# Patient Record
Sex: Male | Born: 1990
Health system: Southern US, Community
[De-identification: ages and names within clinical notes are randomized; demographics above are authoritative.]

## PROBLEM LIST (undated history)

## (undated) DIAGNOSIS — K219 Gastro-esophageal reflux disease without esophagitis: Secondary | ICD-10-CM

## (undated) DIAGNOSIS — E7800 Pure hypercholesterolemia, unspecified: Secondary | ICD-10-CM

---
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
## 2011-11-15 DIAGNOSIS — L731 Pseudofolliculitis barbae: Secondary | ICD-10-CM | POA: Insufficient documentation

## 2012-12-01 ENCOUNTER — Encounter (HOSPITAL_COMMUNITY): Payer: Self-pay | Admitting: Emergency Medicine

## 2012-12-01 ENCOUNTER — Emergency Department (HOSPITAL_COMMUNITY)
Admission: EM | Admit: 2012-12-01 | Discharge: 2012-12-01 | Disposition: A | Discharge status: Home / Self Care | Payer: PRIVATE HEALTH INSURANCE | Source: Home / Self Care

## 2012-12-01 DIAGNOSIS — M549 Dorsalgia, unspecified: Secondary | ICD-10-CM

## 2012-12-01 LAB — POCT URINALYSIS DIP (DEVICE)
Bilirubin Urine: NEGATIVE
Hgb urine dipstick: NEGATIVE
Ketones, ur: NEGATIVE mg/dL
pH: 6 (ref 5.0–8.0)

## 2012-12-01 NOTE — ED Notes (Signed)
Reports right back pain for 3 days.  Denies abdominal pain, denies penile discharge, denies pain with urination.  Patient reports similar episodes that seemed to improve with water, cranberry juice and/or decreasing alcohol intake.

## 2012-12-01 NOTE — ED Provider Notes (Signed)
Joshua Wolfe is a 22 y.o. male who presents to Urgent Care today for right sided mid to low back pain present for the last few days. Not particularly worse with activity. He thinks this is possibly a kidney infection. He denies any dysuria fevers or chills abdominal pain nausea vomiting or diarrhea. He's tried increasing water and cranberry juice intake. He is a Consulting civil engineer at [employer].  The pain is mild to moderate and nonradiating.    PMH reviewed. Healthy otherwise  History  Substance Use Topics  . Smoking status: Never Smoker   . Smokeless tobacco: Not on file  . Alcohol Use: Yes   ROS as above Medications reviewed. No current facility-administered medications for this encounter.   No current outpatient prescriptions on file.    Exam:  BP 105/75  Pulse 58  Temp(Src) 97.3 F (36.3 C) (Oral)  Resp 12  SpO2 100% Gen: Well NAD HEENT: EOMI,  MMM Lungs: CTABL Nl WOB Heart: RRR no MRG Abd: NABS, NT, ND, No CV angle tenderness to percussion Exts: Non edematous BL  LE, warm and well perfused.   Results for orders placed during the hospital encounter of 12/01/12 (from the past 24 hour(s))  POCT URINALYSIS DIP (DEVICE)     Status: None   Collection Time    12/01/12 12:35 PM      Result Value Range   Glucose, UA NEGATIVE  NEGATIVE mg/dL   Bilirubin Urine NEGATIVE  NEGATIVE   Ketones, ur NEGATIVE  NEGATIVE mg/dL   Specific Gravity, Urine <=1.005  1.005 - 1.030   Hgb urine dipstick NEGATIVE  NEGATIVE   pH 6.0  5.0 - 8.0   Protein, ur NEGATIVE  NEGATIVE mg/dL   Urobilinogen, UA 0.2  0.0 - 1.0 mg/dL   Nitrite NEGATIVE  NEGATIVE   Leukocytes, UA NEGATIVE  NEGATIVE   No results found.  Assessment and Plan: 22 y.o. male with back pain. Weekly musculoskeletal. Patient's urine is very dilute because he has been drinking lots of water. I feel the urine culture is reasonable as he may have urine to dilute for the dipstick urine to detect leukocytes.  Plan to treat back pain with  NSAIDs and heat.  Followup as needed.  Discussed warning signs or symptoms. Please see discharge instructions. Patient expresses understanding.      Rodolph Bong, MD 12/01/12 1300

## 2012-12-03 LAB — URINE CULTURE: Culture: NO GROWTH

## 2013-07-30 ENCOUNTER — Emergency Department (INDEPENDENT_AMBULATORY_CARE_PROVIDER_SITE_OTHER)
Admission: EM | Admit: 2013-07-30 | Discharge: 2013-07-30 | Disposition: A | Discharge status: Home / Self Care | Payer: PRIVATE HEALTH INSURANCE | Source: Home / Self Care

## 2013-07-30 ENCOUNTER — Encounter (HOSPITAL_COMMUNITY): Payer: Self-pay | Admitting: Emergency Medicine

## 2013-07-30 DIAGNOSIS — R0789 Other chest pain: Secondary | ICD-10-CM

## 2013-07-30 DIAGNOSIS — R071 Chest pain on breathing: Secondary | ICD-10-CM

## 2013-07-30 DIAGNOSIS — M940 Chondrocostal junction syndrome [Tietze]: Secondary | ICD-10-CM

## 2013-07-30 HISTORY — DX: Gastro-esophageal reflux disease without esophagitis: K21.9

## 2013-07-30 HISTORY — DX: Pure hypercholesterolemia, unspecified: E78.00

## 2013-07-30 NOTE — ED Notes (Signed)
Pain in left chest for a month.  Pain first noticed when working out in gym: felt like "spasm" but no longer feels like "spasm" when it occurs .  Pain is intermittent with a frequency of 2-3 episodes /week.  Pain is sharp, and lasts "few seconds".  Also described "heart feeling weak".  Reports resting and taking slow deep breaths seems to improve how is feeling when these episodes occur.  Mother thinks stress is the cause of pain: patient is a senior at [employer], has several senior projects to complete in a short period of time.  Patient majoring in Lobbyistcomputer science.

## 2013-07-30 NOTE — ED Provider Notes (Signed)
Medical screening examination/treatment/procedure(s) were performed by non-physician practitioner and as supervising physician I was immediately available for consultation/collaboration.  Leslee Homeavid Hristopher Missildine, M.D.  Reuben Likesavid C Traci Gafford, MD 07/30/13 [phone]

## 2013-07-30 NOTE — Discharge Instructions (Signed)
Chest Wall Pain Chest wall pain is pain in or around the bones and muscles of your chest. It may take up to 6 weeks to get better. It may take longer if you must stay physically active in your work and activities.  CAUSES  Chest wall pain may happen on its own. However, it may be caused by:  A viral illness like the flu.  Injury.  Coughing.  Exercise.  Arthritis.  Fibromyalgia.  Shingles. HOME CARE INSTRUCTIONS   Avoid overtiring physical activity. Try not to strain or perform activities that cause pain. This includes any activities using your chest or your abdominal and side muscles, especially if heavy weights are used.  Put ice on the sore area.  Put ice in a plastic bag.  Place a towel between your skin and the bag.  Leave the ice on for 15-20 minutes per hour while awake for the first 2 days.  Only take over-the-counter or prescription medicines for pain, discomfort, or fever as directed by your caregiver. SEEK IMMEDIATE MEDICAL CARE IF:   Your pain increases, or you are very uncomfortable.  You have a fever.  Your chest pain becomes worse.  You have new, unexplained symptoms.  You have nausea or vomiting.  You feel sweaty or lightheaded.  You have a cough with phlegm (sputum), or you cough up blood. MAKE SURE YOU:   Understand these instructions.  Will watch your condition.  Will get help right away if you are not doing well or get worse. Document Released: 04/16/2005 Document Revised: 07/09/2011 Document Reviewed: 12/11/2010 [facility] Patient Information 2014 [place], [place].  Chondral Injury Localized injuries to the surface of joints are known as chondral injuries. In chondral injuries, the articular cartiledge sustains and injury. This may or may not involve the separation of the articular cartiledge from the bone. Chondral injuries do not involve an injury to the underlying bone. These injuries can occur in any joint; although, the knee joint is the  most commonly injured, followed by the ankle, elbow, and shoulder. Chondral injuries occur most frequently in adolescent males. Chondral injuries are difficult to treat because cartilage has a limited ability to heal.  SYMPTOMS   Pain, swelling, or pain or tenderness in the affected joint.  Giving way, locking, or catching of the joint.  Feeling of a free-floating piece of cartilage in the joint.  A crackling sound (crepitation) within the joint with motion.  Often, injuries to other structures within the joint (ligament tears or meniscus injury). CAUSES  Direct trauma (impaction, avulsion, or shearing or rotational forces) to the joint.  RISK INCREASES WITH:  Participation in contact sports or sports in which playing on and falling on hard surfaces may occur.  Adolescence.  Other knee injury (anterior cruciate ligament (ACL) or meniscus tear).  Poor physical strength and flexibility of the joint. PREVENTION  Wear protective equipment (knee, elbow, or shoulder pads) to soften direct trauma.  Wear appropriate-length footwear (cleats for field sorts) for playing surface.  Warm up and stretch properly before physical activity.  Maintain appropriate physical fitness:  Flexibility, strength, and endurance of muscles around joints.  Cardiovascular fitness PROGNOSIS  The prognosis of chondral injuries is dependent on the size of the lesion (injury). Small chondral lesions may not cause problems. Large and/or deep chondral lesions are more problematic, because cartilage does not have the ability to heal. Chondral injuries may go on to develop arthritis of the joint. Usually the symptoms resolve with appropriate treatment, which can include removal  of or fixing loose pieces of cartilage.  RELATED COMPLICATIONS   Frequent recurrence of symptoms, resulting in chronic pain and swelling.  Arthritis of the affected joint.  Loose bodies that cause locking of affected joint. TREATMENT    Treatment initially consists of taking medication and applying ice to reduce pain and inflammation of the affected joint. For injuries to the joints of the leg (knee or ankle), walking with crutches (still allow weight to be placed on the leg) until you walk without a limp is often recommended. You may be asked to perform range-of-motion, stretching, and strengthening exercises. These exercises may be carried out at home, although you may be given a referral to a therapist. Occasionally it may be recommend that you wear a brace, cast, or crutches (for the knee or ankle) to protect or immobilize the joint. If pain persists despite conservative treatment or if loose fragments are present within the joint, surgery is usually recommended. The surgery is typically performed arthroscopically to remove the loose fragments or to re-attach the fragment (if large enough and not deformed). After immobilization or surgery it is important to stretch and strengthen the weakened joint and surrounding muscles (due to the injury, surgery, or the immobilization). This may be done with or without the assistance of a therapist. MEDICATION   If pain medication is necessary, nonsteroidal anti-inflammatory medications, such as aspirin and ibuprofen, or other minor pain relievers, such as acetaminophen, are often recommended.  Do not take pain medication for 7 days before surgery.  Prescription pain relievers are usually only prescribed after surgery. Use only as directed and only as much as you need. HEAT AND COLD  Cold treatment (icing) relieves pain and reduces inflammation. Cold treatment should be applied for 10 to 15 minutes every 2 to 3 hours for inflammation and pain and immediately after any activity that aggravates your symptoms. Use ice packs or an ice massage.  Heat treatment may be used prior to performing the stretching and strengthening activities prescribed by your caregiver, physical therapist, or athletic  trainer. Use a heat pack or a warm soak. SEEK MEDICAL CARE IF:   Symptoms worsen or do not improve in 2 weeks despite treatment.  Any of the following occur after surgery:  Signs of infection: fever, increased pain, swelling, redness, drainage, or bleeding in the surgical area.  Pain, numbness, or coldness in the foot or hand.  Blue, gray, or dark color appears in the toenails or fingernails.  New, unexplained symptoms develop (this may be due to side affects of the drugs used in treatment). Document Released: 04/16/2005 Document Revised: 08/11/2012 Document Reviewed: 07/29/2008 [facility] Patient Information 2014 [place], [place].  Costochondritis Costochondritis, sometimes called Tietze syndrome, is a swelling and irritation (inflammation) of the tissue (cartilage) that connects your ribs with your breastbone (sternum). It causes pain in the chest and rib area. Costochondritis usually goes away on its own over time. It can take up to 6 weeks or longer to get better, especially if you are unable to limit your activities. CAUSES  Some cases of costochondritis have no known cause. Possible causes include:  Injury (trauma).  Exercise or activity such as lifting.  Severe coughing. SIGNS AND SYMPTOMS  Pain and tenderness in the chest and rib area.  Pain that gets worse when coughing or taking deep breaths.  Pain that gets worse with specific movements. DIAGNOSIS  Your health care provider will do a physical exam and ask about your symptoms. Chest X-rays or other tests may  be done to rule out other problems. TREATMENT  Costochondritis usually goes away on its own over time. Your health care provider may prescribe medicine to help relieve pain. HOME CARE INSTRUCTIONS   Avoid exhausting physical activity. Try not to strain your ribs during normal activity. This would include any activities using chest, abdominal, and side muscles, especially if heavy weights are used.  Apply ice to  the affected area for the first 2 days after the pain begins.  Put ice in a plastic bag.  Place a towel between your skin and the bag.  Leave the ice on for 20 minutes, 2 3 times a day.  Only take over-the-counter or prescription medicines as directed by your health care provider. SEEK MEDICAL CARE IF:  You have redness or swelling at the rib joints. These are signs of infection.  Your pain does not go away despite rest or medicine. SEEK IMMEDIATE MEDICAL CARE IF:   Your pain increases or you are very uncomfortable.  You have shortness of breath or difficulty breathing.  You cough up blood.  You have worse chest pains, sweating, or vomiting.  You have a fever or persistent symptoms for more than 2 3 days.  You have a fever and your symptoms suddenly get worse. MAKE SURE YOU:   Understand these instructions.  Will watch your condition.  Will get help right away if you are not doing well or get worse. Document Released: 01/24/2005 Document Revised: 02/04/2013 Document Reviewed: 11/18/2012 [facility] Patient Information 2014 [place], [place].

## 2013-07-30 NOTE — ED Provider Notes (Signed)
CSN: [id number]     Arrival date & time 07/30/13  1224 History   First MD Initiated Contact with Patient 07/30/13 1321     Chief Complaint  Patient presents with  . 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 He states it is random they occur 2-3 times a week. It lasts for a few seconds to a minute. He describes it as sharp. It has been tender in the past but not reproducible today. States he had some shortness of breath with the pain at  the outset however he does not anymore. He has no history of cardiopulmonary problems. Does not smoke. He runs a mile and a half most days of the week. No syncope, diaphoresis or GI symptoms.   Past Medical History  Diagnosis Date  . GERD (gastroesophageal reflux disease)   . High cholesterol    History reviewed. No pertinent past surgical history. No family history on file. History  Substance Use Topics  . Smoking status: Never Smoker   . Smokeless tobacco: Not on file  . Alcohol Use: Yes    Review of Systems  Constitutional: Negative.   HENT: Negative.   Respiratory: Negative.   Cardiovascular: Positive for chest pain. Negative for palpitations and leg swelling.  Gastrointestinal: Negative.   Genitourinary: Negative.   Musculoskeletal: Negative.   Skin: Negative.   Neurological: Negative for dizziness, syncope, light-headedness, numbness and headaches.    Allergies  Review of patient's allergies indicates no known allergies.  Home Medications  No current outpatient prescriptions on file. BP 133/74  Pulse 67  Temp(Src) 98.5 F (36.9 C) (Oral)  Resp 14  SpO2 98% Physical Exam  Nursing note and vitals reviewed. Constitutional: He is oriented to person, place, and time. He appears well-developed and well-nourished. No distress.  Eyes: Conjunctivae  and EOM are normal. Pupils are equal, round, and reactive to light.  Neck: Normal range of motion. Neck supple.  Cardiovascular: Normal rate, regular rhythm, normal heart sounds and intact distal pulses.  Exam reveals no gallop and no friction rub.   No murmur heard. Pulmonary/Chest: Breath sounds normal. No respiratory distress. He has no wheezes. He has no rales.  Abdominal: Soft. He exhibits no distension and no mass. There is no tenderness. There is no guarding.  Musculoskeletal: He exhibits no edema and no tenderness.  Neurological: He is alert and oriented to person, place, and time.  Skin: Skin is warm and dry. He is not diaphoretic.  Psychiatric: He has a normal mood and affect.    ED Course  Procedures (including critical care time) Labs Review Labs Reviewed - No data to display Imaging Review No results found. EKG: NSR, ST T abnormality likely early repol. No ectopy  MDM   1. Left-sided chest wall pain   2. Costochondritis     Reassurance, NSAIDs prn     Hayden Rasmussenavid Jannet Calip, NP 07/30/13 1456  Hayden Rasmussenavid Laisa Larrick, NP 07/30/13 1457

## 2017-01-31 ENCOUNTER — Ambulatory Visit (HOSPITAL_COMMUNITY): Admission: EM | Admit: 2017-01-31 | Discharge: 2017-01-31 | Payer: PRIVATE HEALTH INSURANCE

## 2017-02-15 DIAGNOSIS — K219 Gastro-esophageal reflux disease without esophagitis: Secondary | ICD-10-CM | POA: Insufficient documentation

## 2018-12-09 ENCOUNTER — Other Ambulatory Visit: Payer: Self-pay | Admitting: Family Medicine

## 2018-12-09 ENCOUNTER — Encounter: Payer: Self-pay | Admitting: Family Medicine

## 2018-12-09 ENCOUNTER — Other Ambulatory Visit (HOSPITAL_COMMUNITY)
Admission: RE | Admit: 2018-12-09 | Discharge: 2018-12-09 | Disposition: A | Discharge status: Home / Self Care | Payer: BC Managed Care – PPO | Source: Ambulatory Visit | Attending: Family Medicine | Admitting: Family Medicine

## 2018-12-09 ENCOUNTER — Ambulatory Visit (INDEPENDENT_AMBULATORY_CARE_PROVIDER_SITE_OTHER): Payer: BC Managed Care – PPO | Admitting: Family Medicine

## 2018-12-09 VITALS — BP 120/72 | HR 79 | Temp 98.5°F | Ht 72.0 in | Wt 158.0 lb

## 2018-12-09 DIAGNOSIS — N50811 Right testicular pain: Secondary | ICD-10-CM | POA: Diagnosis not present

## 2018-12-09 LAB — POCT URINALYSIS DIPSTICK
Bilirubin, UA: NEGATIVE
Blood, UA: NEGATIVE
Glucose, UA: NEGATIVE
Ketones, UA: NEGATIVE
Leukocytes, UA: NEGATIVE
Nitrite, UA: NEGATIVE
Protein, UA: NEGATIVE
Spec Grav, UA: 1.015 (ref 1.010–1.025)
Urobilinogen, UA: 0.2 E.U./dL
pH, UA: 6.5 (ref 5.0–8.0)

## 2018-12-09 NOTE — Assessment & Plan Note (Signed)
-  Check UA/GC/Chlamydia/Trichomonas -Will repeat US however fairly low suspicion for torsion and no mass felt.   -Discussed if he continues to have issues I would recommend that he see urology.

## 2018-12-09 NOTE — Progress Notes (Signed)
Joshua CromerChristopher Wolfe - 28 y.o. male MRN [id number]  Date of birth: 21-Apr-1991  Subjective Chief Complaint  Patient presents with  . Pain    pain in right groin area/ swelling/ last 2 weeks/ happened in oct went to ED had US and STD screening everything checked out     HPI Joshua CromerChristopher Wolfe is a 28 y.o. male here today for initial visit and has complaint of R groin/testicular pain.  Current symptoms started about 2 weeks ago.  Has had swelling on upper portion of R testicle, which has improved at this point.  He has had to urinate more frequently but denies pain with urination.  He had similar episode while out in [place] last year.  He had US to r/o torsion, which was normal as well as negative STD testing.  He was diagnosed at that time with epididymitis and instructed to wear supportive underwear.  He reports he is a single partner at this time but has had unprotected sex.    ROS:  A comprehensive ROS was completed and negative except as noted per HPI  No Known Allergies  Past Medical History:  Diagnosis Date  . GERD (gastroesophageal reflux disease)   . High cholesterol     No past surgical history on file.  Social History   Socioeconomic History  . Marital status: Single    Spouse name: Not on file  . Number of children: Not on file  . Years of education: Not on file  . Highest education level: Not on file  Occupational History  . Not on file  Social Needs  . Financial resource strain: Not on file  . Food insecurity    Worry: Not on file    Inability: Not on file  . Transportation needs    Medical: Not on file    Non-medical: Not on file  Tobacco Use  . Smoking status: Never Smoker  . Smokeless tobacco: Never Used  Substance and Sexual Activity  . Alcohol use: Yes  . Drug use: No  . Sexual activity: Not on file  Lifestyle  . Physical activity    Days per week: Not on file    Minutes per session: Not on file  . Stress: Not on file  Relationships  . Social  Musicianconnections    Talks on phone: Not on file    Gets together: Not on file    Attends religious service: Not on file    Active member of club or organization: Not on file    Attends meetings of clubs or organizations: Not on file    Relationship status: Not on file  Other Topics Concern  . Not on file  Social History Narrative  . Not on file    No family history on file.  Health Maintenance  Topic Date Due  . HIV Screening  04/15/2006  . TETANUS/TDAP  04/15/2010  . INFLUENZA VACCINE  11/29/2018    ----------------------------------------------------------------------------------------------------------------------------------------------------------------------------------------------------------------- Physical Exam BP 120/72   Pulse 79   Temp 98.5 F (36.9 C) (Oral)   Ht 6' (1.829 m)   Wt 158 lb (71.7 kg)   SpO2 99%   BMI 21.43 kg/m   Physical Exam Constitutional:      Appearance: Normal appearance.  HENT:     Head: Normocephalic and atraumatic.  Cardiovascular:     Rate and Rhythm: Normal rate and regular rhythm.  Pulmonary:     Effort: Pulmonary effort is normal.     Breath sounds: Normal breath sounds.  Abdominal:  General: Abdomen is flat. There is no distension.     Palpations: There is no mass.     Tenderness: There is no abdominal tenderness.     Hernia: No hernia is present.  Genitourinary:    Penis: Normal.      Scrotum/Testes: Normal.     Comments: Mild ttp along R epididymis.   Skin:    General: Skin is warm and dry.  Neurological:     General: No focal deficit present.     Mental Status: He is alert.  Psychiatric:        Mood and Affect: Mood normal.        Behavior: Behavior normal.     ------------------------------------------------------------------------------------------------------------------------------------------------------------------------------------------------------------------- Assessment and Plan  Pain in right  testicle -Check UA/GC/Chlamydia/Trichomonas -Will repeat US however fairly low suspicion for torsion and no mass felt.   -Discussed if he continues to have issues I would recommend that he see urology.

## 2018-12-09 NOTE — Patient Instructions (Signed)
We'll be in touch with lab results I have ordered an ultrasound, you will be contacted to set this up.

## 2018-12-11 LAB — URINE CYTOLOGY ANCILLARY ONLY
Chlamydia: NEGATIVE
Neisseria Gonorrhea: NEGATIVE
Trichomonas: NEGATIVE

## 2018-12-15 ENCOUNTER — Other Ambulatory Visit: Payer: Self-pay | Admitting: Family Medicine

## 2018-12-15 DIAGNOSIS — N50811 Right testicular pain: Secondary | ICD-10-CM

## 2018-12-16 NOTE — Progress Notes (Signed)
Please let patient know that all STD testing is negative. Thanks!

## 2018-12-17 ENCOUNTER — Ambulatory Visit
Admission: RE | Admit: 2018-12-17 | Discharge: 2018-12-17 | Disposition: A | Discharge status: Home / Self Care | Payer: BC Managed Care – PPO | Source: Ambulatory Visit | Attending: Family Medicine | Admitting: Family Medicine

## 2018-12-17 DIAGNOSIS — N50811 Right testicular pain: Secondary | ICD-10-CM

## 2019-01-30 ENCOUNTER — Other Ambulatory Visit: Payer: Self-pay

## 2019-01-30 DIAGNOSIS — Z20822 Contact with and (suspected) exposure to covid-19: Secondary | ICD-10-CM

## 2019-01-31 LAB — NOVEL CORONAVIRUS, NAA: SARS-CoV-2, NAA: NOT DETECTED

## 2019-12-08 ENCOUNTER — Other Ambulatory Visit: Payer: Self-pay

## 2019-12-08 DIAGNOSIS — Z20822 Contact with and (suspected) exposure to covid-19: Secondary | ICD-10-CM

## 2019-12-10 LAB — SARS-COV-2, NAA 2 DAY TAT

## 2019-12-10 LAB — NOVEL CORONAVIRUS, NAA: SARS-CoV-2, NAA: NOT DETECTED

## 2019-12-16 ENCOUNTER — Encounter: Payer: Self-pay | Admitting: Family Medicine

## 2019-12-16 ENCOUNTER — Ambulatory Visit (INDEPENDENT_AMBULATORY_CARE_PROVIDER_SITE_OTHER): Payer: BC Managed Care – PPO | Admitting: Family Medicine

## 2019-12-16 VITALS — BP 137/69 | HR 78 | Ht 72.0 in | Wt 162.0 lb

## 2019-12-16 DIAGNOSIS — N50811 Right testicular pain: Secondary | ICD-10-CM

## 2019-12-16 DIAGNOSIS — M549 Dorsalgia, unspecified: Secondary | ICD-10-CM | POA: Insufficient documentation

## 2019-12-16 DIAGNOSIS — Z23 Encounter for immunization: Secondary | ICD-10-CM | POA: Diagnosis not present

## 2019-12-16 DIAGNOSIS — M545 Low back pain, unspecified: Secondary | ICD-10-CM

## 2019-12-16 NOTE — Assessment & Plan Note (Signed)
Recurrent pain in R testicle.  Check UA/GC/chlamydia.  Recheck testicular US Referral to urology as this continues to be a recurrent issue.

## 2019-12-16 NOTE — Progress Notes (Signed)
Joshua Wolfe - 29 y.o. male MRN [id number]  Date of birth: 08/25/1990  Subjective Chief Complaint  Patient presents with  . Groin Pain    HPI Joshua Wolfe is a 29 y.o. male here today with complaint of testicular pain.  This is a recurrent problem for him.  He was last seen for this in 11/2018 and had visit to ED in 2019 for same thing.  He had ttp along epididymis last year.  He had [place] last year which was normal and pain resolved on it's own.  This episode started a few days ago.  He denies pain with urination, penile discharge, nausea, fever, chills.  He has only had one sexual partner over the past year.   He has also had some low back pain.  Thinks it may be related to his mattress.      ROS:  A comprehensive ROS was completed and negative except as noted per HPI  No Known Allergies  Past Medical History:  Diagnosis Date  . GERD (gastroesophageal reflux disease)   . High cholesterol     History reviewed. No pertinent surgical history.  Social History   Socioeconomic History  . Marital status: Single    Spouse name: Not on file  . Number of children: Not on file  . Years of education: Not on file  . Highest education level: Not on file  Occupational History  . Not on file  Tobacco Use  . Smoking status: Never Smoker  . Smokeless tobacco: Never Used  Substance and Sexual Activity  . Alcohol use: Yes  . Drug use: No  . Sexual activity: Not on file  Other Topics Concern  . Not on file  Social History Narrative  . Not on file   Social Determinants of Health   Financial Resource Strain:   . Difficulty of Paying Living Expenses:   Food Insecurity:   . Worried About Programme researcher, broadcasting/film/video in the Last Year:   . Barista in the Last Year:   Transportation Needs:   . Freight forwarder (Medical):   Marland Kitchen Lack of Transportation (Non-Medical):   Physical Activity:   . Days of Exercise per Week:   . Minutes of Exercise per Session:   Stress:   .  Feeling of Stress :   Social Connections:   . Frequency of Communication with Friends and Family:   . Frequency of Social Gatherings with Friends and Family:   . Attends Religious Services:   . Active Member of Clubs or Organizations:   . Attends Banker Meetings:   Marland Kitchen Marital Status:     History reviewed. No pertinent family history.  Health Maintenance  Topic Date Due  . TETANUS/TDAP  Never done  . INFLUENZA VACCINE  11/29/2019  . Hepatitis C Screening  12/15/2020 (Originally 1990/07/29)  . HIV Screening  12/15/2020 (Originally 04/15/2006)  . COVID-19 Vaccine  Completed     ----------------------------------------------------------------------------------------------------------------------------------------------------------------------------------------------------------------- Physical Exam BP 137/69   Pulse 78   Ht 6' (1.829 m)   Wt 162 lb (73.5 kg)   SpO2 96%   BMI 21.97 kg/m   Physical Exam Constitutional:      Appearance: Normal appearance.  HENT:     Head: Normocephalic and atraumatic.  Cardiovascular:     Rate and Rhythm: Normal rate and regular rhythm.  Pulmonary:     Effort: Pulmonary effort is normal.     Breath sounds: Normal breath sounds.  Genitourinary:    Comments:  R epididymal tenderness. No swelling or nodules noted bilaterally.  No hernias palpated.  Neurological:     General: No focal deficit present.     Mental Status: He is alert.  Psychiatric:        Mood and Affect: Mood normal.        Behavior: Behavior normal.     ------------------------------------------------------------------------------------------------------------------------------------------------------------------------------------------------------------------- Assessment and Plan  Pain in right testicle Recurrent pain in R testicle.  Check UA/GC/chlamydia.  Recheck testicular US Referral to urology as this continues to be a recurrent issue.   Back  pain He will try using a mattress topper and consider chiropractor.    No orders of the defined types were placed in this encounter.   No follow-ups on file.    This visit occurred during the SARS-CoV-2 public health emergency.  Safety protocols were in place, including screening questions prior to the visit, additional usage of staff PPE, and extensive cleaning of exam room while observing appropriate contact time as indicated for disinfecting solutions.

## 2019-12-16 NOTE — Patient Instructions (Addendum)
We'll be in touch with labs results Testicular ultrasound and referral to urology ordered.  You will be contacted to arrange appts.

## 2019-12-16 NOTE — Addendum Note (Signed)
Addended by: Deno Etienne on: 12/16/2019 04:25 PM   Modules accepted: Orders

## 2019-12-16 NOTE — Assessment & Plan Note (Signed)
He will try using a mattress topper and consider chiropractor.

## 2019-12-18 LAB — URINALYSIS, ROUTINE W REFLEX MICROSCOPIC
Bacteria, UA: NONE SEEN /HPF
Bilirubin Urine: NEGATIVE
Glucose, UA: NEGATIVE
Hgb urine dipstick: NEGATIVE
Hyaline Cast: NONE SEEN /LPF
Ketones, ur: NEGATIVE
Leukocytes,Ua: NEGATIVE
Nitrite: NEGATIVE
RBC / HPF: NONE SEEN /HPF (ref 0–2)
Specific Gravity, Urine: 1.017 (ref 1.001–1.03)
Squamous Epithelial / LPF: NONE SEEN /HPF (ref <=5)
WBC, UA: NONE SEEN /HPF (ref 0–5)
pH: 7 (ref 5.0–8.0)

## 2019-12-18 LAB — CHLAMYDIA/NEISSERIA GONORRHOEAE RNA,TMA,UROGENTIAL
C. trachomatis RNA, TMA: NOT DETECTED
N. gonorrhoeae RNA, TMA: NOT DETECTED

## 2019-12-22 ENCOUNTER — Ambulatory Visit
Admission: RE | Admit: 2019-12-22 | Discharge: 2019-12-22 | Disposition: A | Discharge status: Home / Self Care | Payer: BC Managed Care – PPO | Source: Ambulatory Visit | Attending: Family Medicine | Admitting: Family Medicine

## 2019-12-22 DIAGNOSIS — N50811 Right testicular pain: Secondary | ICD-10-CM

## 2022-01-01 IMAGING — US US SCROTUM W/ DOPPLER COMPLETE
1 series · 13 of 25 positions shown · non-contrast
Comparison: 12/17/2018

CLINICAL DATA: RIGHT testicular pain, episodes for 1 year

EXAM:
SCROTAL ULTRASOUND
DOPPLER ULTRASOUND OF THE TESTICLES
TECHNIQUE: Complete ultrasound examination of the testicles, epididymis, and
other scrotal structures was performed. Color and spectral Doppler
ultrasound were also utilized to evaluate blood flow to the
testicles.

[Series 1: us scrotum w/ doppler complete · 0.08mm/px · 13 of 128 slices shown]
[im 1/128]
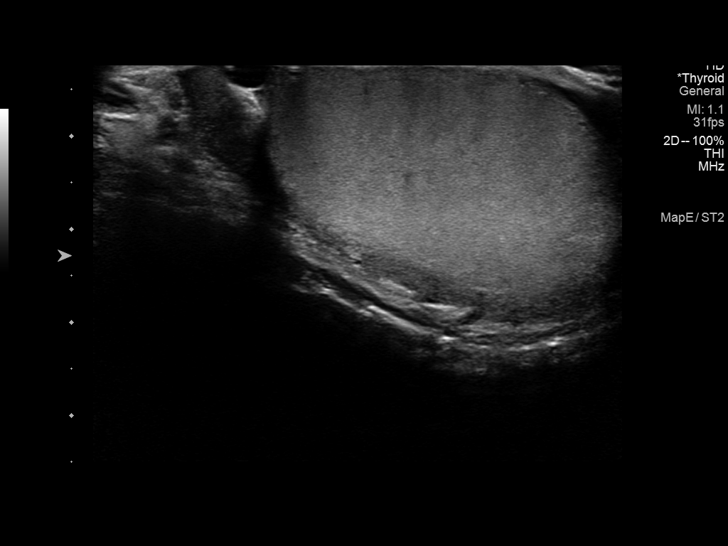
[im 11/128]
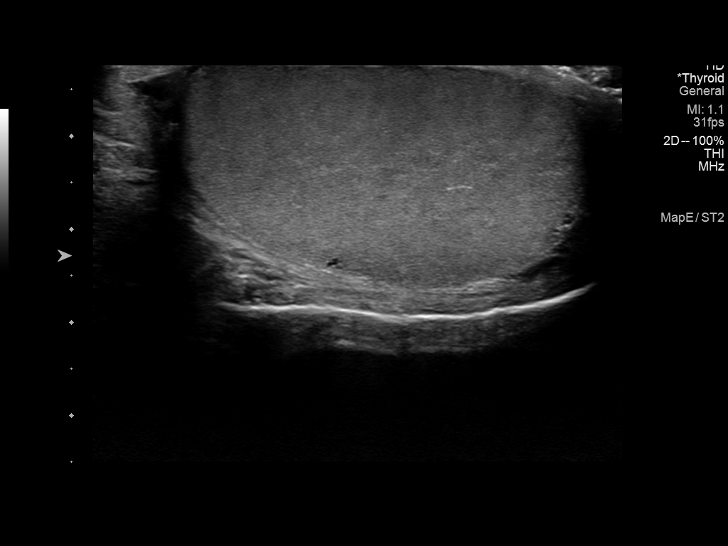
[im 22/128]
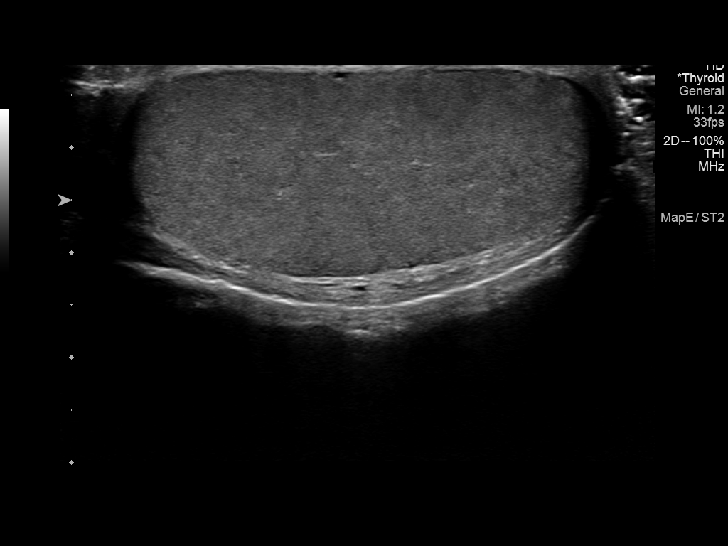
[im 32/128]
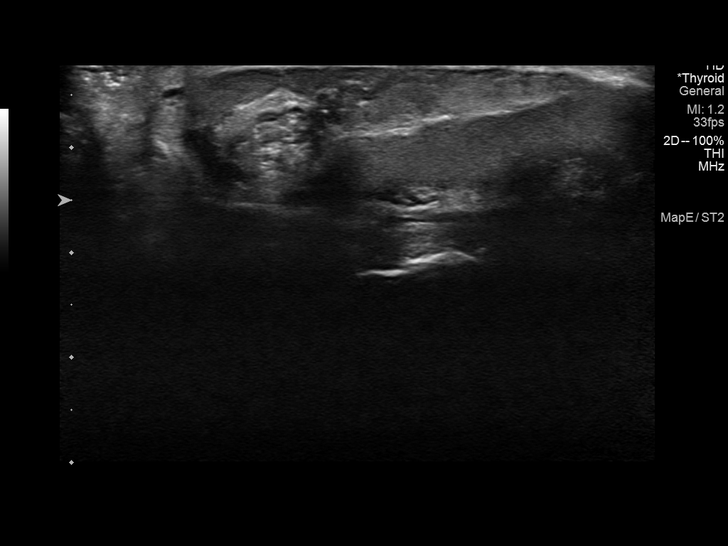
[im 43/128]
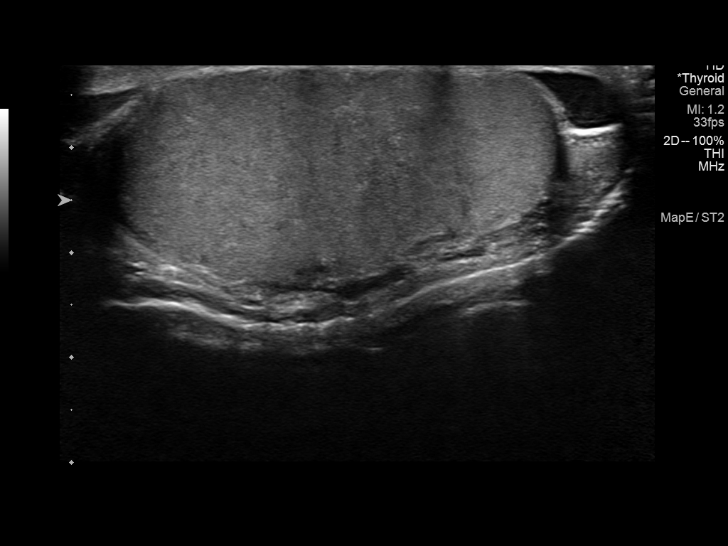
[im 53/128]
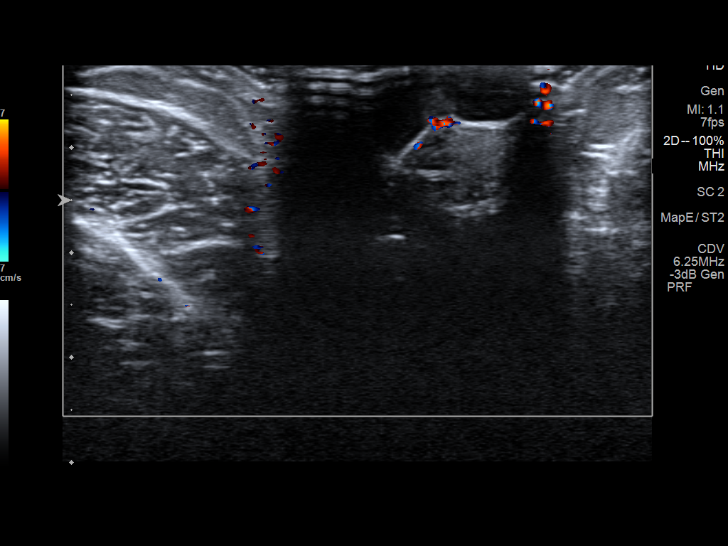
[im 64/128]
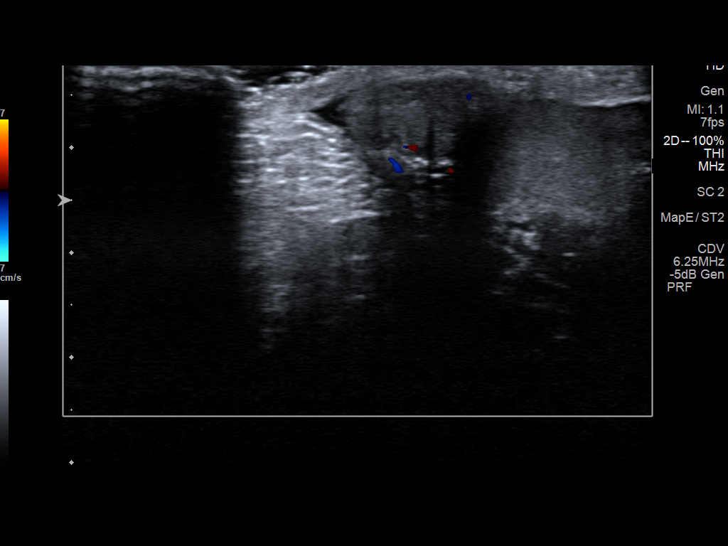
[im 75/128]
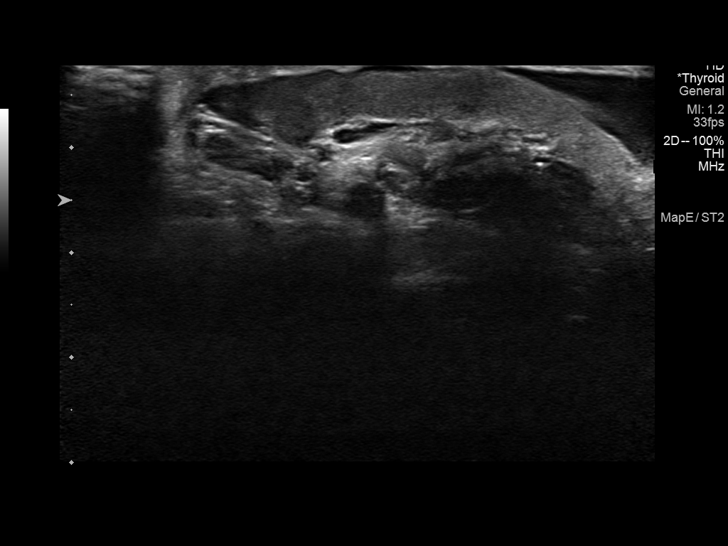
[im 85/128]
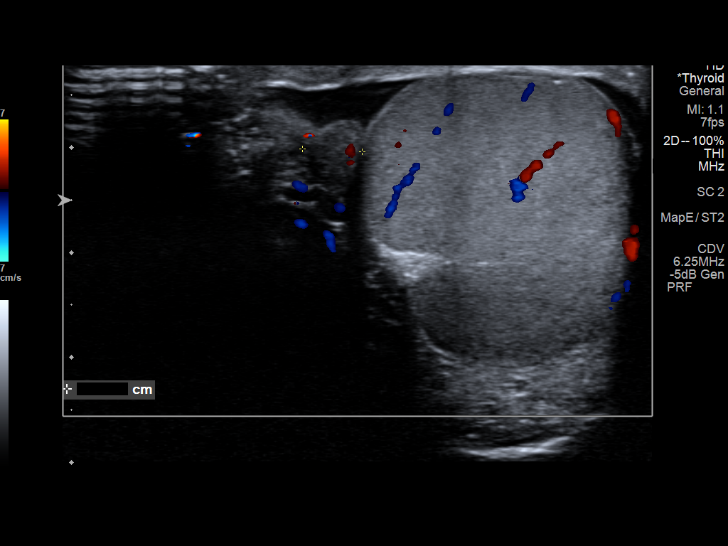
[im 96/128]
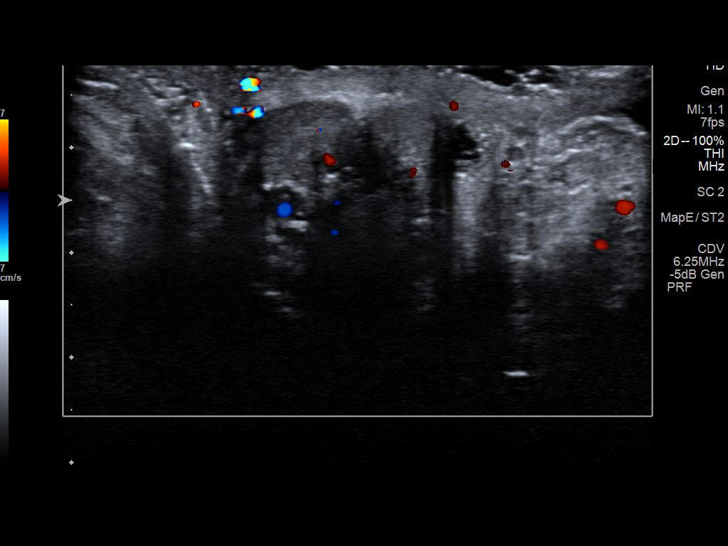
[im 106/128]
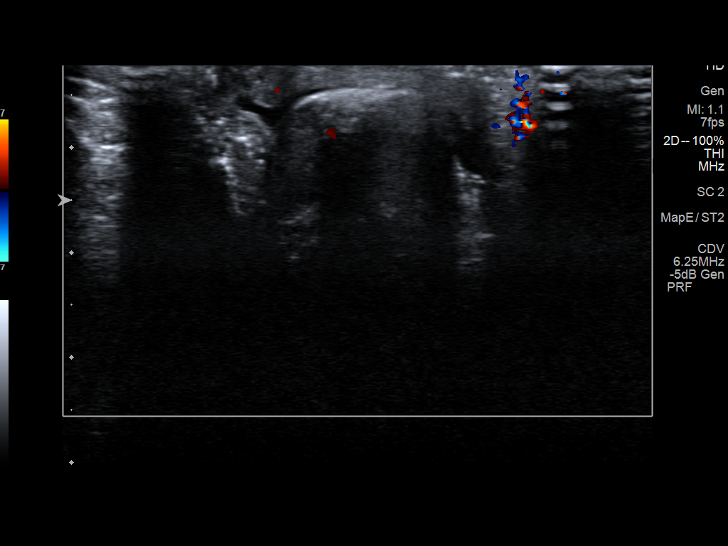
[im 117/128]
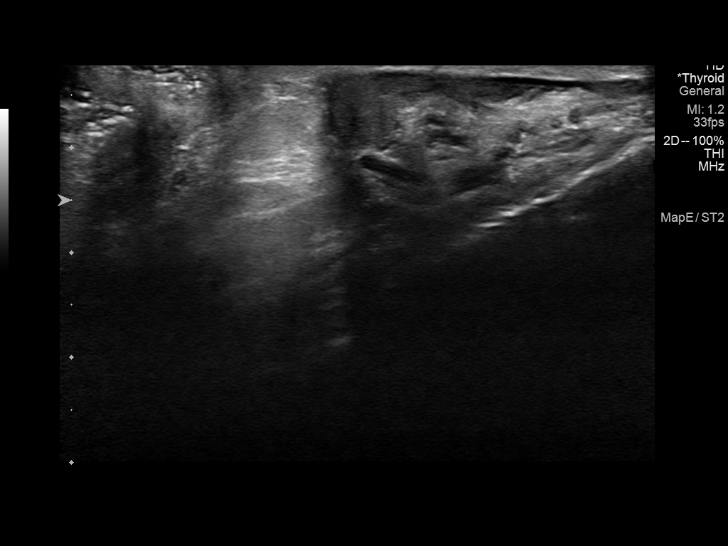
[im 128/128]
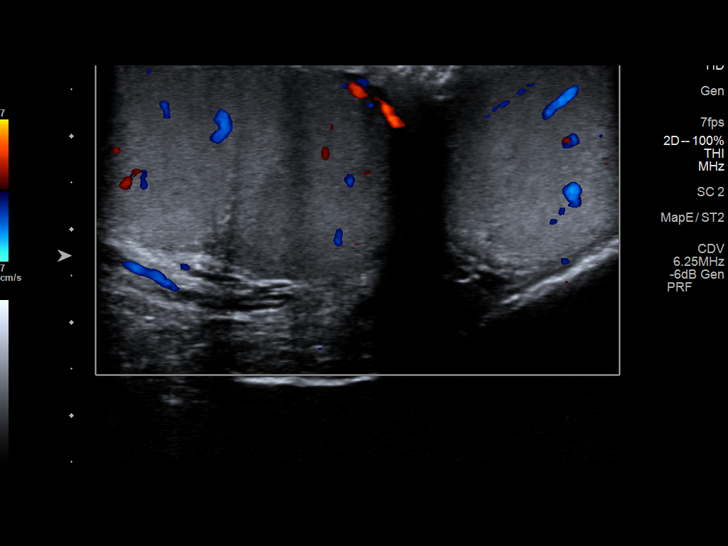

[13 of 25 positions shown; findings below may reference images not displayed]

FINDINGS: Right testicle

Measurements: 4.2 x 2.4 x 2.5 cm. Normal echogenicity without mass
or calcification. Internal blood flow present on color Doppler
imaging.

Left testicle

Measurements: 4.4 x 2.1 x 2.4 cm. Normal echogenicity without mass
or calcification. Internal blood flow present on color Doppler
imaging.

Right epididymis: 5 mm cyst RIGHT epididymis. Questionably 4 mm cyst
RIGHT epididymal head. No additional masses. Heterogeneous
echogenicity. Small

Left epididymis:  Normal appearance

Hydrocele:  Small minimally complicated LEFT hydrocele

Varicocele:  None visualized.

Pulsed Doppler interrogation of both testes demonstrates normal low
resistance arterial and venous waveforms bilaterally.
IMPRESSION: Two probable small cysts of the RIGHT epididymis.

Small minimally complicated LEFT hydrocele.

Unremarkable testes and LEFT epididymis.

Remainder of exam unremarkable.

## 2023-01-28 ENCOUNTER — Telehealth: Payer: Self-pay | Admitting: Family Medicine

## 2023-01-28 NOTE — Telephone Encounter (Signed)
Patient called to schedule an appointment hasn't been seen since August 2021 can he Dr. Ashley Royalty see him or does he need to be scheduled with Dr. Tamera Punt  Phone [phone]

## 2023-02-18 ENCOUNTER — Encounter: Payer: Self-pay | Admitting: Family Medicine

## 2023-02-18 ENCOUNTER — Ambulatory Visit: Payer: BC Managed Care – PPO

## 2023-02-18 ENCOUNTER — Ambulatory Visit: Payer: BC Managed Care – PPO | Admitting: Family Medicine

## 2023-02-18 VITALS — BP 113/70 | HR 63 | Ht 71.85 in | Wt 173.2 lb

## 2023-02-18 DIAGNOSIS — M4056 Lordosis, unspecified, lumbar region: Secondary | ICD-10-CM

## 2023-02-18 DIAGNOSIS — G8929 Other chronic pain: Secondary | ICD-10-CM

## 2023-02-18 DIAGNOSIS — I7300 Raynaud's syndrome without gangrene: Secondary | ICD-10-CM

## 2023-02-18 DIAGNOSIS — K6289 Other specified diseases of anus and rectum: Secondary | ICD-10-CM | POA: Diagnosis not present

## 2023-02-18 DIAGNOSIS — M545 Low back pain, unspecified: Secondary | ICD-10-CM

## 2023-02-18 NOTE — Progress Notes (Signed)
Joshua Wolfe - 32 y.o. male MRN [id number]  Date of birth: [birth year]  Subjective Chief Complaint  Patient presents with   Establish Care   Back Pain   Rectal Pain   Cold Extremity    HPI Joshua Wolfe is a 32 y.o. male here today to re-establish care.  He was las seen by me >3 years ago.  He was in pretty good health at that time. He has concerns about recurrent rectal pain.  Feels like he has possible hemorrhoid but never resolves.  Notices protrusion from the anal area.  He does have some bleeding from time to time when wiping.  He does have some low back pain as well.  Pain has been intermittent over the past year.  Denies radiation, numbness or tingling.  He notes that his fingers are cold at times.  Has color change when his fingers feel cold.  Denies pain associated with this.   ROS:  A comprehensive ROS was completed and negative except as noted per HPI   Past Medical History:  Diagnosis Date   GERD (gastroesophageal reflux disease)    High cholesterol     History reviewed. No pertinent surgical history.  Social History   Socioeconomic History   Marital status: Single    Spouse name: Not on file   Number of children: Not on file   Years of education: Not on file   Highest education level: Not on file  Occupational History   Not on file  Tobacco Use   Smoking status: Never    Passive exposure: Never   Smokeless tobacco: Never  Vaping Use   Vaping status: Never Used  Substance and Sexual Activity   Alcohol use: Yes    Alcohol/week: 2.0 standard drinks of alcohol    Types: 2 Standard drinks or equivalent per week   Drug use: Yes    Frequency: 0.5 times per week    Types: Marijuana   Sexual activity: Yes    Partners: Female    Birth control/protection: None  Other Topics Concern   Not on file  Social History Narrative   Not on file   Social Determinants of Health   Financial Resource Strain: Not on file  Food Insecurity: Not on file   Transportation Needs: Not on file  Physical Activity: Not on file  Stress: Not on file  Social Connections: Not on file    History reviewed. No pertinent family history.  Health Maintenance  Topic Date Due   HIV Screening  Never done   Hepatitis C Screening  Never done   INFLUENZA VACCINE  07/29/2023 (Originally 11/29/2022)   COVID-19 Vaccine (3 - 2023-24 season) 03/05/2024 (Originally 12/30/2022)   DTaP/Tdap/Td (2 - Td or Tdap) 12/15/2029   HPV VACCINES  Aged Out     ----------------------------------------------------------------------------------------------------------------------------------------------------------------------------------------------------------------- Physical Exam BP 113/70 (BP Location: Left Arm, Patient Position: Sitting, Cuff Size: Normal)   Pulse 63   Ht 5' 11.85" (1.825 m)   Wt 173 lb 3.2 oz (78.6 kg)   SpO2 100%   BMI 23.59 kg/m   Physical Exam Constitutional:      Appearance: Normal appearance.  HENT:     Head: Normocephalic and atraumatic.  Cardiovascular:     Rate and Rhythm: Normal rate and regular rhythm.  Pulmonary:     Effort: Pulmonary effort is normal.     Breath sounds: Normal breath sounds.  Abdominal:     General: There is no distension.     Palpations: Abdomen is soft.  Tenderness: There is no abdominal tenderness.  Genitourinary:    Comments: Mucosal tissue noted to be protruding from the anus. Neurological:     Mental Status: He is alert.  Psychiatric:        Mood and Affect: Mood normal.        Behavior: Behavior normal.     ------------------------------------------------------------------------------------------------------------------------------------------------------------------------------------------------------------------- Assessment and Plan  Raynaud's phenomenon without gangrene Checking labs including CMP, CBC and TSH today.  Encouraged to avoid cold environments.  Mass of perianal area Question  prolapse versus mass versus other.  Referral placed to colorectal surgery for further evaluation.  Back pain Chronic, intermittent episodes.  X-rays ordered.   No orders of the defined types were placed in this encounter.   No follow-ups on file.    This visit occurred during the SARS-CoV-2 public health emergency.  Safety protocols were in place, including screening questions prior to the visit, additional usage of staff PPE, and extensive cleaning of exam room while observing appropriate contact time as indicated for disinfecting solutions.

## 2023-02-19 LAB — CBC WITH DIFFERENTIAL/PLATELET
Basophils Absolute: 0 10*3/uL (ref 0.0–0.2)
Basos: 1 %
EOS (ABSOLUTE): 0.1 10*3/uL (ref 0.0–0.4)
Eos: 1 %
Hematocrit: 48.6 % (ref 37.5–51.0)
Hemoglobin: 16.4 g/dL (ref 13.0–17.7)
Immature Grans (Abs): 0 10*3/uL (ref 0.0–0.1)
Immature Granulocytes: 0 %
Lymphocytes Absolute: 2.3 10*3/uL (ref 0.7–3.1)
Lymphs: 48 %
MCH: 31.5 pg (ref 26.6–33.0)
MCHC: 33.7 g/dL (ref 31.5–35.7)
MCV: 93 fL (ref 79–97)
Monocytes Absolute: 0.4 10*3/uL (ref 0.1–0.9)
Monocytes: 9 %
Neutrophils Absolute: 1.9 10*3/uL (ref 1.4–7.0)
Neutrophils: 41 %
Platelets: 200 10*3/uL (ref 150–450)
RBC: 5.21 x10E6/uL (ref 4.14–5.80)
RDW: 12.7 % (ref 11.6–15.4)
WBC: 4.7 10*3/uL (ref 3.4–10.8)

## 2023-02-19 LAB — CMP14+EGFR
ALT: 13 [IU]/L (ref 0–44)
AST: 19 [IU]/L (ref 0–40)
Albumin: 4.9 g/dL (ref 4.1–5.1)
Alkaline Phosphatase: 70 [IU]/L (ref 44–121)
BUN/Creatinine Ratio: 10 (ref 9–20)
BUN: 13 mg/dL (ref 6–20)
Bilirubin Total: 0.8 mg/dL (ref 0.0–1.2)
CO2: 25 mmol/L (ref 20–29)
Calcium: 10.2 mg/dL (ref 8.7–10.2)
Chloride: 101 mmol/L (ref 96–106)
Creatinine, Ser: 1.25 mg/dL (ref 0.76–1.27)
Globulin, Total: 2.5 g/dL (ref 1.5–4.5)
Glucose: 88 mg/dL (ref 70–99)
Potassium: 4 mmol/L (ref 3.5–5.2)
Sodium: 140 mmol/L (ref 134–144)
Total Protein: 7.4 g/dL (ref 6.0–8.5)
eGFR: 79 mL/min/{1.73_m2} (ref >59)

## 2023-02-19 LAB — TSH+FREE T4
Free T4: 1.28 ng/dL (ref 0.82–1.77)
TSH: 1.95 u[IU]/mL (ref 0.450–4.500)

## 2023-02-24 DIAGNOSIS — K6289 Other specified diseases of anus and rectum: Secondary | ICD-10-CM | POA: Insufficient documentation

## 2023-02-24 DIAGNOSIS — I7300 Raynaud's syndrome without gangrene: Secondary | ICD-10-CM | POA: Insufficient documentation

## 2023-02-24 NOTE — Assessment & Plan Note (Signed)
Checking labs including CMP, CBC and TSH today.  Encouraged to avoid cold environments.

## 2023-02-24 NOTE — Assessment & Plan Note (Signed)
Question prolapse versus mass versus other.  Referral placed to colorectal surgery for further evaluation.

## 2023-02-24 NOTE — Assessment & Plan Note (Signed)
Chronic, intermittent episodes.  X-rays ordered.

## 2024-02-07 ENCOUNTER — Ambulatory Visit (INDEPENDENT_AMBULATORY_CARE_PROVIDER_SITE_OTHER): Admitting: Family Medicine

## 2024-02-07 ENCOUNTER — Encounter: Payer: Self-pay | Admitting: Family Medicine

## 2024-02-07 VITALS — BP 123/73 | HR 57 | Ht 71.0 in | Wt 171.0 lb

## 2024-02-07 DIAGNOSIS — L301 Dyshidrosis [pompholyx]: Secondary | ICD-10-CM | POA: Insufficient documentation

## 2024-02-07 DIAGNOSIS — Z1159 Encounter for screening for other viral diseases: Secondary | ICD-10-CM

## 2024-02-07 DIAGNOSIS — Z0000 Encounter for general adult medical examination without abnormal findings: Secondary | ICD-10-CM | POA: Diagnosis not present

## 2024-02-07 DIAGNOSIS — Z13220 Encounter for screening for lipoid disorders: Secondary | ICD-10-CM

## 2024-02-07 DIAGNOSIS — Z114 Encounter for screening for human immunodeficiency virus [HIV]: Secondary | ICD-10-CM

## 2024-02-07 MED ORDER — CLOBETASOL PROPIONATE 0.05 % EX CREA: 1.0000 | TOPICAL_CREAM | Freq: Two times a day (BID) | CUTANEOUS | 0 refills | Status: AC

## 2024-02-07 NOTE — Assessment & Plan Note (Signed)
 Well adult Orders Placed This Encounter  Procedures   CBC with Differential/Platelet   CMP14+EGFR   Lipid Panel With LDL/HDL Ratio   Hepatitis C Antibody   HIV antibody (with reflex)   Ambulatory referral to Dermatology    Referral Priority:   Routine    Referral Type:   Consultation    Referral Reason:   Specialty Services Required    Requested Specialty:   Dermatology    Number of Visits Requested:   1  Screening: per lab orders Immunizations:  Declines Anticiaptory guidance/Risk factor reduction:  Recommendations per AVS.

## 2024-02-07 NOTE — Assessment & Plan Note (Signed)
 Trial of clobetasol as needed. Referral to dermatology.

## 2024-02-07 NOTE — Progress Notes (Signed)
 Joshua Wolfe - 33 y.o. male MRN [id number]  Date of birth: 1990-05-16  Subjective Chief Complaint  Patient presents with   Annual Exam    Back Pain Referral to dermatologist     HPI Joshua Wolfe is a 33 y.o. male here today for annual exam.   He reports that he continues to have some intermittent back pain.  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  Previous Xray with some spasm noted.   He plans to see sports med  He has had rash on his hands.  Itchy at times with some peeling of skin.  Moisturizer has helped.  Would like to see dermatology.   He is moderately active.  He feels that diet is pretty good.   He is a non-smoker.  Occasional EtOH.   Review of Systems  Constitutional:  Negative for chills, fever, malaise/fatigue and weight loss.  HENT:  Negative for congestion, ear pain and sore throat.   Eyes:  Negative for blurred vision, double vision and pain.  Respiratory:  Negative for cough and shortness of breath.   Cardiovascular:  Negative for chest pain and palpitations.  Gastrointestinal:  Negative for abdominal pain, blood in stool, constipation, heartburn and nausea.  Genitourinary:  Negative for dysuria and urgency.  Musculoskeletal:  Negative for joint pain and myalgias.  Neurological:  Negative for dizziness and headaches.  Endo/Heme/Allergies:  Does not bruise/bleed easily.  Psychiatric/Behavioral:  Negative for depression. The patient is not nervous/anxious and does not have insomnia.     Allergies  Allergen Reactions   Pineapple Shortness Of Breath    Past Medical History:  Diagnosis Date   GERD (gastroesophageal reflux disease)    High cholesterol     No past surgical history on file.  Social History   Socioeconomic History   Marital status: Single    Spouse name: Not on file   Number of children: Not on file   Years of education: Not on file   Highest education level: Not on file  Occupational History   Not on file  Tobacco  Use   Smoking status: Never    Passive exposure: Never   Smokeless tobacco: Never  Vaping Use   Vaping status: Never Used  Substance and Sexual Activity   Alcohol use: Yes    Alcohol/week: 2.0 standard drinks of alcohol    Types: 2 Standard drinks or equivalent per week   Drug use: Yes    Frequency: 0.5 times per week    Types: Marijuana   Sexual activity: Yes    Partners: Female    Birth control/protection: None  Other Topics Concern   Not on file  Social History Narrative   Not on file   Social Drivers of Health   Financial Resource Strain: Not on file  Food Insecurity: Not on file  Transportation Needs: Not on file  Physical Activity: Not on file  Stress: Not on file  Social Connections: Unknown (02/19/2023)   Received from [facility]   Social Network    Social Network: Not on file    No family history on file.  Health Maintenance  Topic Date Due   HIV Screening  Never done   Hepatitis C Screening  Never done   Influenza Vaccine  07/28/2024 (Originally 11/29/2023)   Hepatitis B Vaccines 19-59 Average Risk (1 of 3 - 19+ 3-dose series) 02/06/2025 (Originally 04/15/2010)   HPV VACCINES (1 - 3-dose SCDM series) 02/06/2025 (Originally 04/15/2018)   COVID-19 Vaccine (3 -  2025-26 season) 02/22/2025 (Originally 12/30/2023)   DTaP/Tdap/Td (2 - Td or Tdap) 12/15/2029   Pneumococcal Vaccine  Aged Out   Meningococcal B Vaccine  Aged Out     ----------------------------------------------------------------------------------------------------------------------------------------------------------------------------------------------------------------- Physical Exam BP 123/73   Pulse (!) 57   Ht 5' 11 (1.803 m)   Wt 171 lb (77.6 kg)   SpO2 100%   BMI 23.85 kg/m   Physical Exam Constitutional:      General: He is not in acute distress.    Appearance: Normal appearance.  HENT:     Head: Normocephalic and atraumatic.     Right Ear: Tympanic membrane and external ear  normal.     Left Ear: Tympanic membrane and external ear normal.  Eyes:     General: No scleral icterus. Neck:     Thyroid: No thyromegaly.  Cardiovascular:     Rate and Rhythm: Normal rate and regular rhythm.     Heart sounds: Normal heart sounds.  Pulmonary:     Effort: Pulmonary effort is normal.     Breath sounds: Normal breath sounds.  Abdominal:     General: Bowel sounds are normal. There is no distension.     Palpations: Abdomen is soft.     Tenderness: There is no abdominal tenderness. There is no guarding.  Musculoskeletal:     Cervical back: Normal range of motion.  Lymphadenopathy:     Cervical: No cervical adenopathy.  Skin:    General: Skin is warm and dry.     Findings: No rash.  Neurological:     Mental Status: He is alert and oriented to person, place, and time.     Cranial Nerves: No cranial nerve deficit.     Motor: No abnormal muscle tone.  Psychiatric:        Mood and Affect: Mood normal.        Behavior: Behavior normal.     ------------------------------------------------------------------------------------------------------------------------------------------------------------------------------------------------------------------- Assessment and Plan  Dyshidrotic eczema Trial of clobetasol as needed. Referral to dermatology.   Well adult exam Well adult Orders Placed This Encounter  Procedures   CBC with Differential/Platelet   CMP14+EGFR   Lipid Panel With LDL/HDL Ratio   Hepatitis C Antibody   HIV antibody (with reflex)   Ambulatory referral to Dermatology    Referral Priority:   Routine    Referral Type:   Consultation    Referral Reason:   Specialty Services Required    Requested Specialty:   Dermatology    Number of Visits Requested:   1  Screening: per lab orders Immunizations:  Declines Anticiaptory guidance/Risk factor reduction:  Recommendations per AVS.    Meds ordered this encounter  Medications   clobetasol cream  (TEMOVATE) 0.05 %    Sig: Apply 1 Application topically 2 (two) times daily.    Dispense:  30 g    Refill:  0    No follow-ups on file.

## 2024-02-07 NOTE — Patient Instructions (Signed)
 Preventive Care 11-33 Years Old, Male Preventive care refers to lifestyle choices and visits with your health care provider that can promote health and wellness. Preventive care visits are also called wellness exams. What can I expect for my preventive care visit? Counseling During your preventive care visit, your health care provider may ask about your: Medical history, including: Past medical problems. Family medical history. Current health, including: Emotional well-being. Home life and relationship well-being. Sexual activity. Lifestyle, including: Alcohol, nicotine or tobacco, and drug use. Access to firearms. Diet, exercise, and sleep habits. Safety issues such as seatbelt and bike helmet use. Sunscreen use. Work and work Astronomer. Physical exam Your health care provider may check your: Height and weight. These may be used to calculate your BMI (body mass index). BMI is a measurement that tells if you are at a healthy weight. Waist circumference. This measures the distance around your waistline. This measurement also tells if you are at a healthy weight and may help predict your risk of certain diseases, such as type 2 diabetes and high blood pressure. Heart rate and blood pressure. Body temperature. Skin for abnormal spots. What immunizations do I need?  Vaccines are usually given at various ages, according to a schedule. Your health care provider will recommend vaccines for you based on your age, medical history, and lifestyle or other factors, such as travel or where you work. What tests do I need? Screening Your health care provider may recommend screening tests for certain conditions. This may include: Lipid and cholesterol levels. Diabetes screening. This is done by checking your blood sugar (glucose) after you have not eaten for a while (fasting). Hepatitis B test. Hepatitis C test. HIV (human immunodeficiency virus) test. STI (sexually transmitted infection)  testing, if you are at risk. Talk with your health care provider about your test results, treatment options, and if necessary, the need for more tests. Follow these instructions at home: Eating and drinking  Eat a healthy diet that includes fresh fruits and vegetables, whole grains, lean protein, and low-fat dairy products. Drink enough fluid to keep your urine pale yellow. Take vitamin and mineral supplements as recommended by your health care provider. Do not drink alcohol if your health care provider tells you not to drink. If you drink alcohol: Limit how much you have to 0-2 drinks a day. Know how much alcohol is in your drink. In the [place] one drink equals one 12 oz bottle of beer (355 mL), one 5 oz glass of wine (148 mL), or one 1 oz glass of hard liquor (44 mL). Lifestyle Brush your teeth every morning and night with fluoride toothpaste. Floss one time each day. Exercise for at least 30 minutes 5 or more days each week. Do not use any products that contain nicotine or tobacco. These products include cigarettes, chewing tobacco, and vaping devices, such as e-cigarettes. If you need help quitting, ask your health care provider. Do not use drugs. If you are sexually active, practice safe sex. Use a condom or other form of protection to prevent STIs. Find healthy ways to manage stress, such as: Meditation, yoga, or listening to music. Journaling. Talking to a trusted person. Spending time with friends and family. Minimize exposure to UV radiation to reduce your risk of skin cancer. Safety Always wear your seat belt while driving or riding in a vehicle. Do not drive: If you have been drinking alcohol. Do not ride with someone who has been drinking. If you have been using any mind-altering substances  or drugs. While texting. When you are tired or distracted. Wear a helmet and other protective equipment during sports activities. If you have firearms in your house, make sure you  follow all gun safety procedures. Seek help if you have been physically or sexually abused. What's next? Go to your health care provider once a year for an annual wellness visit. Ask your health care provider how often you should have your eyes and teeth checked. Stay up to date on all vaccines. This information is not intended to replace advice given to you by your health care provider. Make sure you discuss any questions you have with your health care provider. Document Revised: 10/12/2020 Document Reviewed: 10/12/2020 Elsevier Patient Education  2024 [employer].

## 2024-02-08 LAB — CBC WITH DIFFERENTIAL/PLATELET
Basophils Absolute: 0 x10E3/uL (ref 0.0–0.2)
Basos: 1 %
EOS (ABSOLUTE): 0.1 x10E3/uL (ref 0.0–0.4)
Eos: 3 %
Hematocrit: 48.9 % (ref 37.5–51.0)
Hemoglobin: 15.9 g/dL (ref 13.0–17.7)
Immature Grans (Abs): 0 x10E3/uL (ref 0.0–0.1)
Immature Granulocytes: 0 %
Lymphocytes Absolute: 1.8 x10E3/uL (ref 0.7–3.1)
Lymphs: 46 %
MCH: 31.1 pg (ref 26.6–33.0)
MCHC: 32.5 g/dL (ref 31.5–35.7)
MCV: 96 fL (ref 79–97)
Monocytes Absolute: 0.3 x10E3/uL (ref 0.1–0.9)
Monocytes: 8 %
Neutrophils Absolute: 1.7 x10E3/uL (ref 1.4–7.0)
Neutrophils: 42 %
Platelets: 189 x10E3/uL (ref 150–450)
RBC: 5.12 x10E6/uL (ref 4.14–5.80)
RDW: 12.8 % (ref 11.6–15.4)
WBC: 3.9 x10E3/uL (ref 3.4–10.8)

## 2024-02-08 LAB — CMP14+EGFR
ALT: 13 IU/L (ref 0–44)
AST: 19 IU/L (ref 0–40)
Albumin: 4.6 g/dL (ref 4.1–5.1)
Alkaline Phosphatase: 68 IU/L (ref 47–123)
BUN/Creatinine Ratio: 13 (ref 9–20)
BUN: 13 mg/dL (ref 6–20)
Bilirubin Total: 0.6 mg/dL (ref 0.0–1.2)
CO2: 25 mmol/L (ref 20–29)
Calcium: 9.8 mg/dL (ref 8.7–10.2)
Chloride: 105 mmol/L (ref 96–106)
Creatinine, Ser: 1.04 mg/dL (ref 0.76–1.27)
Globulin, Total: 2.3 g/dL (ref 1.5–4.5)
Glucose: 90 mg/dL (ref 70–99)
Potassium: 4.4 mmol/L (ref 3.5–5.2)
Sodium: 142 mmol/L (ref 134–144)
Total Protein: 6.9 g/dL (ref 6.0–8.5)
eGFR: 98 mL/min/1.73 (ref >59)

## 2024-02-08 LAB — LIPID PANEL WITH LDL/HDL RATIO
Cholesterol, Total: 189 mg/dL (ref 100–199)
HDL: 55 mg/dL (ref >39)
LDL Chol Calc (NIH): 123 mg/dL — ABNORMAL HIGH (ref 0–99)
LDL/HDL Ratio: 2.2 ratio (ref 0.0–3.6)
Triglycerides: 60 mg/dL (ref 0–149)
VLDL Cholesterol Cal: 11 mg/dL (ref 5–40)

## 2024-02-08 LAB — HIV ANTIBODY (ROUTINE TESTING W REFLEX): HIV Screen 4th Generation wRfx: NONREACTIVE

## 2024-02-08 LAB — HEPATITIS C ANTIBODY: Hep C Virus Ab: NONREACTIVE

## 2024-02-21 ENCOUNTER — Ambulatory Visit: Payer: Self-pay | Admitting: Family Medicine

## 2024-09-14 ENCOUNTER — Ambulatory Visit: Admitting: Physician Assistant
